# Patient Record
Sex: Female | Born: 2000 | Race: White | Hispanic: No | Marital: Single | Smoking: Never smoker
Health system: Southern US, Community
[De-identification: ages and names within clinical notes are randomized; demographics above are authoritative.]

## PROBLEM LIST (undated history)

## (undated) DIAGNOSIS — F419 Anxiety disorder, unspecified: Secondary | ICD-10-CM

## (undated) DIAGNOSIS — J45909 Unspecified asthma, uncomplicated: Secondary | ICD-10-CM

## (undated) DIAGNOSIS — J302 Other seasonal allergic rhinitis: Secondary | ICD-10-CM

## (undated) DIAGNOSIS — F410 Panic disorder [episodic paroxysmal anxiety] without agoraphobia: Secondary | ICD-10-CM

## (undated) HISTORY — DX: Anxiety disorder, unspecified: F41.9

## (undated) HISTORY — PX: OTHER SURGICAL HISTORY: SHX169

## (undated) HISTORY — DX: Panic disorder (episodic paroxysmal anxiety): F41.0

---
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
## 2021-01-15 ENCOUNTER — Telehealth (INDEPENDENT_AMBULATORY_CARE_PROVIDER_SITE_OTHER): Payer: 59 | Admitting: Psychiatry

## 2021-01-15 ENCOUNTER — Encounter (HOSPITAL_COMMUNITY): Payer: Self-pay | Admitting: Psychiatry

## 2021-01-15 DIAGNOSIS — F4010 Social phobia, unspecified: Secondary | ICD-10-CM | POA: Diagnosis not present

## 2021-01-15 DIAGNOSIS — F411 Generalized anxiety disorder: Secondary | ICD-10-CM

## 2021-01-15 MED ORDER — ESCITALOPRAM OXALATE 20 MG PO TABS: 1.0000 | ORAL_TABLET | Freq: Every day | ORAL | 0 refills | Status: DC

## 2021-01-15 NOTE — Progress Notes (Signed)
Psychiatric Initial Adult Assessment   Patient Identification: Becky Terrell MRN:  [id number] Date of Evaluation:  01/15/2021 Referral Source: dr. Milana Kidney Chief Complaint:  Establish care, anxiety Visit Diagnosis:    ICD-10-CM   1. Generalized anxiety disorder  F41.1   2. Social anxiety disorder  F40.10    Virtual Visit via Video Note  I connected with Becky Terrell on 01/15/21 at  9:00 AM EDT by a video enabled telemedicine application and verified that I am speaking with the correct person using two identifiers.  Location: Patient: parked car Provider: home office   I discussed the limitations of evaluation and management by telemedicine and the availability of in person appointments. The patient expressed understanding and agreed to proceed.      I discussed the assessment and treatment plan with the patient. The patient was provided an opportunity to ask questions and all were answered. The patient agreed with the plan and demonstrated an understanding of the instructions.   The patient was advised to call back or seek an in-person evaluation if the symptoms worsen or if the condition fails to improve as anticipated.  I provided 40  minutes of non-face-to-face time during this encounter including chart review and documentation   History of Present Illness: Patient is a 20 years old currently single Caucasian female referred by Dr. Milana Kidney for establishing care as an adult for anxiety she is planning to go to [employer] college and is currently graduating from high school patient lives with her parents  Has been diagnosed social anxiety and generalized anxiety disorder starting around age 14 says she was given a presentation about climate control and felt about the end of the world that lead to a panic attack and she had to call her mom was having suicidal thoughts  She has excessive anxiety in the past and unreasonable at times she was diagnosed with social anxiety of being  uncomfortable crowded situation and also went shopping all specialty and buying shoes it is difficult to go around and shop  No past abuse at home her mom has been diagnosed with autism there is history of alcohol use and neuropathy in the family  She is still fears about health and future and climate control that has led to anxiety  Dr. Milana Kidney has been giving her Lexapro that has helped her reasonably she still has anxiety and worries but not excessive denies panic attacks She is looking forward to go to [employer]  Denies clear episodes of depression no clear episodes or history of mania or psychotic symptoms  She does take hydroxyzine as needed for anxiety as well and Lexapro regularly that has been \\balancing her anxiety  Aggravating factors; fear of climate control fear of health and her family Modifying factors parents, friends online support groups or gaming's  Duration since middle school Past psychiatric admission with suicide attempt denies Denies use of drugs and alcohol   Past Psychiatric History: anxiety  Previous Psychotropic Medications: Yes   Substance Abuse History in the last 12 months:  No.  Consequences of Substance Abuse: NA  Past Medical History:  Past Medical History:  Diagnosis Date  . Anxiety   . Asthma   . Panic disorder   . Seasonal allergies     Past Surgical History:  Procedure Laterality Date  . tear duc     Right eye    Family Psychiatric History: Grand father alcohol use, father, anxiety  Family History:  Family History  Problem Relation Age of  Onset  . Hypertension Father     Social History:   Social History   Socioeconomic History  . Marital status: Single    Spouse name: Not on file  . Number of children: Not on file  . Years of education: Not on file  . Highest education level: 9th grade  Occupational History  . Not on file  Tobacco Use  . Smoking status: Never Smoker  . Smokeless tobacco: Never Used   Vaping Use  . Vaping Use: Never used  Substance and Sexual Activity  . Alcohol use: No  . Drug use: No  . Sexual activity: Not Currently  Other Topics Concern  . Not on file  Social History Narrative  . Not on file   Social Determinants of Health   Financial Resource Strain: Not on file  Food Insecurity: Not on file  Transportation Needs: Not on file  Physical Activity: Not on file  Stress: Not on file  Social Connections: Not on file    Additional Social History: grew up with parents, no abuse  Allergies:  No Known Allergies  Metabolic Disorder Labs: No results found for: HGBA1C, MPG No results found for: PROLACTIN No results found for: CHOL, TRIG, HDL, CHOLHDL, VLDL, LDLCALC No results found for: TSH  Therapeutic Level Labs: No results found for: LITHIUM No results found for: CBMZ No results found for: VALPROATE  Current Medications: Current Outpatient Medications  Medication Sig Dispense Refill  . albuterol (PROVENTIL HFA;VENTOLIN HFA) 108 (90 Base) MCG/ACT inhaler Inhale into the lungs every 6 (six) hours as needed for wheezing or shortness of breath.    . escitalopram (LEXAPRO) 20 MG tablet Take 1 tablet (20 mg total) by mouth daily. 90 tablet 0  . hydrOXYzine (ATARAX/VISTARIL) 10 MG tablet Take 1-2 three times/day as needed for anxiety 120 tablet 1  . loratadine (CLARITIN) 10 MG tablet Take 10 mg by mouth daily.     No current facility-administered medications for this visit.      Psychiatric Specialty Exam: Review of Systems  Cardiovascular: Negative for chest pain.  Psychiatric/Behavioral: Negative for agitation, dysphoric mood and self-injury.    There were no vitals taken for this visit.There is no height or weight on file to calculate BMI.  General Appearance: Casual  Eye Contact:  Fair  Speech:  Clear and Coherent  Volume:  Normal  Mood:  Euthymic  Affect:  Congruent  Thought Process:  Goal Directed  Orientation:  Full (Time, Place, and  Person)  Thought Content:  Rumination  Suicidal Thoughts:  No  Homicidal Thoughts:  No  Memory:  Immediate;   Fair Recent;   Fair  Judgement:  Good  Insight:  Good  Psychomotor Activity:  Normal  Concentration:  Concentration: Fair and Attention Span: Fair  Recall:  [employer] of Knowledge:Good  Language: Good  Akathisia:  No  Handed:   AIMS (if indicated):  not done  Assets:  Financial Resources/Insurance Physical Health Social Support  ADL's:  Intact  Cognition: WNL  Sleep:  Fair   Screenings: GAD-7   Flowsheet Row Office Visit from 06/01/2018 in [facility] [place]  Total GAD-7 Score 10    PHQ2-9   Flowsheet Row Video Visit from 01/15/2021 in [facility] AT [place] Office Visit from 10/30/2020 in [facility] AT [place] Office Visit from 06/01/2018 in [facility] AT [place]  PHQ-2 Total Score 0 0 0  PHQ-9 Total Score -- -- 2  Flowsheet Row Video Visit from 01/15/2021 in [facility] AT [place] Office Visit from 10/30/2020 in [facility] AT [place]  C-SSRS RISK CATEGORY No Risk No Risk      Assessment and Plan: as follows  Generalized anxiety disorder reasonably controlled continue Lexapro 20 mg hydroxyzine if needed  Social anxiety and panic attacks; reasonably controlled but still struggles at times and continues with therapy she goes to annual counseling and that has been helping for the last couple of years continue work on coping skills and distraction also medication Lexapro  Follow-up in 3 months or earlier if needed reviewed medications   Thresa Ross, MD 6/6/20229:37 AM

## 2021-04-23 ENCOUNTER — Encounter (HOSPITAL_COMMUNITY): Payer: Self-pay | Admitting: Psychiatry

## 2021-04-23 ENCOUNTER — Telehealth (INDEPENDENT_AMBULATORY_CARE_PROVIDER_SITE_OTHER): Payer: 59 | Admitting: Psychiatry

## 2021-04-23 DIAGNOSIS — F411 Generalized anxiety disorder: Secondary | ICD-10-CM | POA: Diagnosis not present

## 2021-04-23 DIAGNOSIS — F4010 Social phobia, unspecified: Secondary | ICD-10-CM | POA: Diagnosis not present

## 2021-04-23 MED ORDER — ESCITALOPRAM OXALATE 20 MG PO TABS: 20.0000 mg | ORAL_TABLET | Freq: Every day | ORAL | 0 refills | Status: DC

## 2021-04-23 NOTE — Progress Notes (Signed)
[facility] Follow Up Visit  Patient Identification: Becky Terrell MRN:  [id number] Date of Evaluation:  04/23/2021 Referral Source: dr. Milana Kidney Chief Complaint: follow up anxiety Visit Diagnosis:    ICD-10-CM   1. Generalized anxiety disorder  F41.1     2. Social anxiety disorder  F40.10      Virtual Visit via Video Note  I connected with Becky Terrell on 04/23/21 at  3:00 PM EDT by a video enabled telemedicine application and verified that I am speaking with the correct person using two identifiers.  Location: Patient: parked car Provider: home office   I discussed the limitations of evaluation and management by telemedicine and the availability of in person appointments. The patient expressed understanding and agreed to proceed.      I discussed the assessment and treatment plan with the patient. The patient was provided an opportunity to ask questions and all were answered. The patient agreed with the plan and demonstrated an understanding of the instructions.   The patient was advised to call back or seek an in-person evaluation if the symptoms worsen or if the condition fails to improve as anticipated.  I provided 12 minutes of non-face-to-face time during this encounter.   History of Present Illness: Patient is a 20 years old currently single Caucasian female initially referred by Dr. Milana Kidney for establishing care as an adult for anxiety she is planning to go to [employer] college and is currently graduating from high school patient lives with her parents  Has been diagnosed social anxiety and generalized anxiety disorder starting around age 89    She is doing fair her great-grandmother died today so the family is adjusting to that she was sick overall Lexapro is helping the anxiety and the fear of climate control  Social anxiety is not worse  She is still fears about health and future and climate control that has led to anxiety but manageable  Has to take as needed hydroxyzine  but it makes her sedated so overall anxiety is manageable with Lexapro on a daily basis   Aggravating factors; fear of climate control fear of health and her family Modifying factors; parents, online friends and gaming'   Duration since middle school Past psychiatric admission with suicide attempt denies Denies use of drugs and alcohol   Past Psychiatric History: anxiety  Previous Psychotropic Medications: Yes    Past Medical History:  Past Medical History:  Diagnosis Date   Anxiety    Asthma    Panic disorder    Seasonal allergies     Past Surgical History:  Procedure Laterality Date   tear duc     Right eye    Family Psychiatric History: Grand father alcohol use, father, anxiety  Family History:  Family History  Problem Relation Age of Onset   Hypertension Father     Social History:   Social History   Socioeconomic History   Marital status: Single    Spouse name: Not on file   Number of children: Not on file   Years of education: Not on file   Highest education level: 9th grade  Occupational History   Not on file  Tobacco Use   Smoking status: Never   Smokeless tobacco: Never  Vaping Use   Vaping Use: Never used  Substance and Sexual Activity   Alcohol use: No   Drug use: No   Sexual activity: Not Currently  Other Topics Concern   Not on file  Social History Narrative   Not on file  Social Determinants of Health   Financial Resource Strain: Not on file  Food Insecurity: Not on file  Transportation Needs: Not on file  Physical Activity: Not on file  Stress: Not on file  Social Connections: Not on file    Allergies:  No Known Allergies  Metabolic Disorder Labs: No results found for: HGBA1C, MPG No results found for: PROLACTIN No results found for: CHOL, TRIG, HDL, CHOLHDL, VLDL, LDLCALC No results found for: TSH  Therapeutic Level Labs: No results found for: LITHIUM No results found for: CBMZ No results found for:  VALPROATE  Current Medications: Current Outpatient Medications  Medication Sig Dispense Refill   albuterol (PROVENTIL HFA;VENTOLIN HFA) 108 (90 Base) MCG/ACT inhaler Inhale into the lungs every 6 (six) hours as needed for wheezing or shortness of breath.     escitalopram (LEXAPRO) 20 MG tablet Take 1 tablet (20 mg total) by mouth daily. 90 tablet 0   hydrOXYzine (ATARAX/VISTARIL) 10 MG tablet Take 1-2 three times/day as needed for anxiety 120 tablet 1   loratadine (CLARITIN) 10 MG tablet Take 10 mg by mouth daily.     No current facility-administered medications for this visit.      Psychiatric Specialty Exam: Review of Systems  Cardiovascular:  Negative for chest pain.  Psychiatric/Behavioral:  Negative for agitation, dysphoric mood and self-injury.    There were no vitals taken for this visit.There is no height or weight on file to calculate BMI.  General Appearance: Casual  Eye Contact:  Fair  Speech:  Clear and Coherent  Volume:  Normal  Mood:  Euthymic  Affect:  Congruent  Thought Process:  Goal Directed  Orientation:  Full (Time, Place, and Person)  Thought Content:  Rumination  Suicidal Thoughts:  No  Homicidal Thoughts:  No  Memory:  Immediate;   Fair Recent;   Fair  Judgement:  Good  Insight:  Good  Psychomotor Activity:  Normal  Concentration:  Concentration: Fair and Attention Span: Fair  Recall:  [employer] of Knowledge:Good  Language: Good  Akathisia:  No  Handed:   AIMS (if indicated):  not done  Assets:  Financial Resources/Insurance Physical Health Social Support  ADL's:  Intact  Cognition: WNL  Sleep:  Fair   Screenings: GAD-7    Flowsheet Row Office Visit from 06/01/2018 in [facility] AT [place]  Total GAD-7 Score 10      PHQ2-9    Flowsheet Row Video Visit from 01/15/2021 in [facility] AT [place] Office Visit from 10/30/2020 in [facility] AT [place]  Office Visit from 06/01/2018 in [facility] AT [place]  PHQ-2 Total Score 0 0 0  PHQ-9 Total Score -- -- 2      Flowsheet Row Video Visit from 04/23/2021 in [facility] AT [place] Video Visit from 01/15/2021 in [facility] AT [place] Office Visit from 10/30/2020 in [facility] AT [place]  C-SSRS RISK CATEGORY No Risk No Risk No Risk       Assessment and Plan: as follows Prior documentation reviewed Generalized anxiety disorder  manageable continue Lexapro and hydroxyzine if need Social anxiety and panic attacks; reasonably controlled but still struggles at times sporadic sometimes struggles with anxiety especially social anxiety but Lexapro does help continue  Provided supportive therapy regarding to her grief of great-grandmother they were going to [place] in [place]  Follow-up in 6 months she says that since she is not adjusting medication  she wants to speak feeling less often and is doing reasonable  Thresa Ross, MD 9/12/20223:12 PM

## 2021-10-03 ENCOUNTER — Telehealth (HOSPITAL_COMMUNITY): Payer: Self-pay

## 2021-10-03 NOTE — Telephone Encounter (Signed)
Medication management - Telephone call with pt's Mother, after receiving a refill request for pt's Escitalopram from her [facility].  Collateral stated she still has approximately 1/2 a bottle so informed pt she needs to call to schedule a new appointment prior to running out.  Collateral stated she would have pt call back 10/04/21 to set up a new appointment with Dr.Akhtar.

## 2021-10-15 ENCOUNTER — Encounter (HOSPITAL_COMMUNITY): Payer: Self-pay | Admitting: Psychiatry

## 2021-10-15 ENCOUNTER — Other Ambulatory Visit: Payer: Self-pay

## 2021-10-15 ENCOUNTER — Telehealth (INDEPENDENT_AMBULATORY_CARE_PROVIDER_SITE_OTHER): Payer: 59 | Admitting: Psychiatry

## 2021-10-15 DIAGNOSIS — F411 Generalized anxiety disorder: Secondary | ICD-10-CM

## 2021-10-15 DIAGNOSIS — F4010 Social phobia, unspecified: Secondary | ICD-10-CM

## 2021-10-15 MED ORDER — ESCITALOPRAM OXALATE 20 MG PO TABS: 20.0000 mg | ORAL_TABLET | Freq: Every day | ORAL | 0 refills | Status: AC

## 2021-10-15 NOTE — Progress Notes (Signed)
[place] Follow Up Visit ? ?Patient Identification: Becky Terrell ?MRN:  [id number] ?Date of Evaluation:  10/15/2021 ?Referral Source: dr. Melanee Left ?Chief Complaint: follow up anxiety ?Visit Diagnosis:  ?  ICD-10-CM   ?1. Generalized anxiety disorder  F41.1   ?  ?2. Social anxiety disorder  F40.10   ?  ? ?Virtual Visit via Video Note ? ?I connected with Becky Terrell on 10/15/21 at  9:00 AM EST by a video enabled telemedicine application and verified that I am speaking with the correct person using two identifiers. ? ?Location: ?Patient: home ?Provider: home office ?  ?I discussed the limitations of evaluation and management by telemedicine and the availability of in person appointments. The patient expressed understanding and agreed to proceed. ? ? ? ?  ?I discussed the assessment and treatment plan with the patient. The patient was provided an opportunity to ask questions and all were answered. The patient agreed with the plan and demonstrated an understanding of the instructions. ?  ?The patient was advised to call back or seek an in-person evaluation if the symptoms worsen or if the condition fails to improve as anticipated. ? ?I provided 15 minutes of non-face-to-face time during this encounter including chart review and documentation ? ? ?History of Present Illness: Patient is a 21 years old currently single Caucasian female initially referred by Dr. Melanee Left for establishing care  ? ? ?Going to [employer] and working ?Doing fair with anxiety, handling social anxiety and job stress and promoted ?Supportive mom ? ? ?Aggravating factors; fear of climate control fear of health and her family ?Modifying factors; parents, online friends and gaming' ?Severity improved ? ? ? ? Past Psychiatric History: anxiety ? ?Previous Psychotropic Medications: Yes  ? ? ?Past Medical History:  ?Past Medical History:  ?Diagnosis Date  ? Anxiety   ? Asthma   ? Panic disorder   ? Seasonal allergies   ?  ?Past Surgical History:  ?Procedure  Laterality Date  ? tear duc    ? Right eye  ? ? ?Family Psychiatric History: Grand father alcohol use, father, anxiety ? ?Family History:  ?Family History  ?Problem Relation Age of Onset  ? Hypertension Father   ? ? ?Social History:   ?Social History  ? ?Socioeconomic History  ? Marital status: Single  ?  Spouse name: Not on file  ? Number of children: Not on file  ? Years of education: Not on file  ? Highest education level: 9th grade  ?Occupational History  ? Not on file  ?Tobacco Use  ? Smoking status: Never  ? Smokeless tobacco: Never  ?Vaping Use  ? Vaping Use: Never used  ?Substance and Sexual Activity  ? Alcohol use: No  ? Drug use: No  ? Sexual activity: Not Currently  ?Other Topics Concern  ? Not on file  ?Social History Narrative  ? Not on file  ? ?Social Determinants of Health  ? ?Financial Resource Strain: Not on file  ?Food Insecurity: Not on file  ?Transportation Needs: Not on file  ?Physical Activity: Not on file  ?Stress: Not on file  ?Social Connections: Not on file  ? ? ?Allergies:  No Known Allergies ? ?Metabolic Disorder Labs: ?No results found for: HGBA1C, MPG ?No results found for: PROLACTIN ?No results found for: CHOL, TRIG, HDL, CHOLHDL, VLDL, LDLCALC ?No results found for: TSH ? ?Therapeutic Level Labs: ?No results found for: LITHIUM ?No results found for: CBMZ ?No results found for: VALPROATE ? ?Current Medications: ?Current Outpatient Medications  ?Medication  Sig Dispense Refill  ? albuterol (PROVENTIL HFA;VENTOLIN HFA) 108 (90 Base) MCG/ACT inhaler Inhale into the lungs every 6 (six) hours as needed for wheezing or shortness of breath.    ? escitalopram (LEXAPRO) 20 MG tablet Take 1 tablet (20 mg total) by mouth daily. 90 tablet 0  ? hydrOXYzine (ATARAX/VISTARIL) 10 MG tablet Take 1-2 three times/day as needed for anxiety 120 tablet 1  ? loratadine (CLARITIN) 10 MG tablet Take 10 mg by mouth daily.    ? ?No current facility-administered medications for this visit.  ? ? ? ? ?Psychiatric  Specialty Exam: ?Review of Systems  ?Cardiovascular:  Negative for chest pain.  ?Psychiatric/Behavioral:  Negative for agitation, dysphoric mood and self-injury.    ?There were no vitals taken for this visit.There is no height or weight on file to calculate BMI.  ?General Appearance: Casual  ?Eye Contact:  Fair  ?Speech:  Clear and Coherent  ?Volume:  Normal  ?Mood:  Euthymic  ?Affect:  Congruent  ?Thought Process:  Goal Directed  ?Orientation:  Full (Time, Place, and Person)  ?Thought Content:  Rumination  ?Suicidal Thoughts:  No  ?Homicidal Thoughts:  No  ?Memory:  Immediate;   Fair ?Recent;   Fair  ?Judgement:  Good  ?Insight:  Good  ?Psychomotor Activity:  Normal  ?Concentration:  Concentration: Fair and Attention Span: Fair  ?Recall:  Fair  ?[employer] [place]  ?Language: Good  ?Akathisia:  No  ?Handed:   ?AIMS (if indicated):  not done  ?Assets:  Financial Resources/Insurance ?Physical Health ?Social Support  ?ADL's:  Intact  ?Cognition: WNL  ?Sleep:  Fair  ? ?Screenings: ?GAD-7   ? ?[place] Office Visit from 06/01/2018 in [facility]  ?Total GAD-7 Score 10  ? ?  ? ?PHQ2-9   ? ?Flowsheet Row Video Visit from 01/15/2021 in [facility] Office Visit from 10/30/2020 in [facility] Office Visit from 06/01/2018 in [facility]  ?PHQ-2 Total Score 0 0 0  ?PHQ-9 Total Score -- -- 2  ? ?  ? ?Flowsheet Row Video Visit from 10/15/2021 in [facility] [place] Video Visit from 04/23/2021 in [facility] Video Visit from 01/15/2021 in [facility]  ?C-SSRS RISK CATEGORY No Risk No Risk No Risk  ? ?  ? ? ?Assessment and Plan: as follows ? ?Prior documentation reviewed ? ?Generalized anxiety disorder fair with hydroxyzine and lexapro ? ?Social anxiety and panic  attacks; doing good and better, continue lexapro ?Seldom takes vistaril has refill of this ? ? ?Follow-up in 6 months  ?Lexapro refill sent ? ?Merian Capron, MD ?3/6/20239:14 AM ?

## 2021-10-22 ENCOUNTER — Telehealth (HOSPITAL_COMMUNITY): Payer: 59 | Admitting: Psychiatry

## 2022-04-29 ENCOUNTER — Telehealth (HOSPITAL_COMMUNITY): Payer: 59 | Admitting: Psychiatry

## 2022-05-06 ENCOUNTER — Telehealth (HOSPITAL_COMMUNITY): Payer: 59 | Admitting: Psychiatry
